# Patient Record
Sex: Male | Born: 1990 | Race: Black or African American | Hispanic: No | Marital: Single | Smoking: Never smoker
Health system: Southern US, Community
[De-identification: ages and names within clinical notes are randomized; demographics above are authoritative.]

## PROBLEM LIST (undated history)

## (undated) DIAGNOSIS — J45909 Unspecified asthma, uncomplicated: Secondary | ICD-10-CM

---
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
## 2012-01-09 ENCOUNTER — Encounter (HOSPITAL_COMMUNITY): Payer: Self-pay

## 2012-01-09 ENCOUNTER — Emergency Department (HOSPITAL_COMMUNITY)
Admission: EM | Admit: 2012-01-09 | Discharge: 2012-01-09 | Disposition: A | Discharge status: Home / Self Care | Payer: No Typology Code available for payment source | Attending: Emergency Medicine | Admitting: Emergency Medicine

## 2012-01-09 DIAGNOSIS — R51 Headache: Secondary | ICD-10-CM | POA: Insufficient documentation

## 2012-01-09 DIAGNOSIS — S0990XA Unspecified injury of head, initial encounter: Secondary | ICD-10-CM | POA: Insufficient documentation

## 2012-01-09 MED ORDER — IBUPROFEN 800 MG PO TABS: 800.0000 mg | ORAL_TABLET | Freq: Three times a day (TID) | ORAL | Status: AC | PRN
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
## 2012-01-21 NOTE — ED Provider Notes (Signed)
Evaluation and management procedures were performed by the PA/NP under my supervision/collaboration.    Felisa Bonier, MD 01/21/12 [phone]

## 2013-01-08 ENCOUNTER — Emergency Department (HOSPITAL_COMMUNITY)
Admission: EM | Admit: 2013-01-08 | Discharge: 2013-01-08 | Disposition: A | Discharge status: Home / Self Care | Payer: No Typology Code available for payment source | Attending: Emergency Medicine | Admitting: Emergency Medicine

## 2013-01-08 ENCOUNTER — Emergency Department (HOSPITAL_COMMUNITY): Payer: No Typology Code available for payment source

## 2013-01-08 ENCOUNTER — Encounter (HOSPITAL_COMMUNITY): Payer: Self-pay | Admitting: Emergency Medicine

## 2013-01-08 DIAGNOSIS — Y92410 Unspecified street and highway as the place of occurrence of the external cause: Secondary | ICD-10-CM | POA: Insufficient documentation

## 2013-01-08 DIAGNOSIS — S161XXA Strain of muscle, fascia and tendon at neck level, initial encounter: Secondary | ICD-10-CM

## 2013-01-08 DIAGNOSIS — S139XXA Sprain of joints and ligaments of unspecified parts of neck, initial encounter: Secondary | ICD-10-CM | POA: Insufficient documentation

## 2013-01-08 DIAGNOSIS — Y9389 Activity, other specified: Secondary | ICD-10-CM | POA: Insufficient documentation

## 2013-01-08 DIAGNOSIS — Z87891 Personal history of nicotine dependence: Secondary | ICD-10-CM | POA: Insufficient documentation

## 2013-01-08 MED ORDER — NAPROXEN 500 MG PO TABS: 500.0000 mg | ORAL_TABLET | Freq: Two times a day (BID) | ORAL | Status: DC

## 2013-01-08 MED ORDER — CYCLOBENZAPRINE HCL 10 MG PO TABS: 10.0000 mg | ORAL_TABLET | Freq: Two times a day (BID) | ORAL | Status: DC | PRN
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
## 2013-01-08 NOTE — ED Notes (Signed)
Patient transported to X-ray 

## 2013-01-08 NOTE — ED Notes (Signed)
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

## 2013-01-08 NOTE — ED Provider Notes (Signed)
Medical screening examination/treatment/procedure(s) were performed by non-physician practitioner and as supervising physician I was immediately available for consultation/collaboration.   Dione Booze, MD 01/08/13 [phone]

## 2013-07-22 ENCOUNTER — Emergency Department (HOSPITAL_COMMUNITY)
Admission: EM | Admit: 2013-07-22 | Discharge: 2013-07-22 | Disposition: A | Discharge status: Home / Self Care | Payer: No Typology Code available for payment source | Attending: Emergency Medicine | Admitting: Emergency Medicine

## 2013-07-22 ENCOUNTER — Encounter (HOSPITAL_COMMUNITY): Payer: Self-pay | Admitting: Emergency Medicine

## 2013-07-22 DIAGNOSIS — Y9389 Activity, other specified: Secondary | ICD-10-CM | POA: Insufficient documentation

## 2013-07-22 DIAGNOSIS — Z87891 Personal history of nicotine dependence: Secondary | ICD-10-CM | POA: Insufficient documentation

## 2013-07-22 DIAGNOSIS — Y92009 Unspecified place in unspecified non-institutional (private) residence as the place of occurrence of the external cause: Secondary | ICD-10-CM | POA: Insufficient documentation

## 2013-07-22 DIAGNOSIS — S0990XA Unspecified injury of head, initial encounter: Secondary | ICD-10-CM | POA: Insufficient documentation

## 2013-07-22 DIAGNOSIS — S0993XA Unspecified injury of face, initial encounter: Secondary | ICD-10-CM | POA: Insufficient documentation

## 2013-07-22 DIAGNOSIS — IMO0002 Reserved for concepts with insufficient information to code with codable children: Secondary | ICD-10-CM | POA: Insufficient documentation

## 2013-07-22 DIAGNOSIS — M542 Cervicalgia: Secondary | ICD-10-CM

## 2013-07-22 DIAGNOSIS — M549 Dorsalgia, unspecified: Secondary | ICD-10-CM

## 2013-07-22 HISTORY — DX: Unspecified asthma, uncomplicated: J45.909

## 2013-07-22 MED ORDER — CYCLOBENZAPRINE HCL 10 MG PO TABS: 10.0000 mg | ORAL_TABLET | Freq: Three times a day (TID) | ORAL | Status: DC | PRN

## 2013-07-22 MED ORDER — IBUPROFEN 800 MG PO TABS: 800.0000 mg | ORAL_TABLET | Freq: Three times a day (TID) | ORAL | Status: DC | PRN
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
## 2013-07-22 NOTE — ED Notes (Addendum)
PA at bedside.

## 2013-07-22 NOTE — Progress Notes (Signed)
[facility] provided patient with a list of primary care resources. Patient stated that he may have insurance through his parents, but didn't have the information.

## 2013-07-23 NOTE — ED Provider Notes (Signed)
Medical screening examination/treatment/procedure(s) were performed by non-physician practitioner and as supervising physician I was immediately available for consultation/collaboration.   Macayla Ekdahl, MD 07/23/13 0030 
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
## 2014-02-01 ENCOUNTER — Emergency Department (HOSPITAL_COMMUNITY): Payer: Self-pay

## 2014-02-01 ENCOUNTER — Encounter (HOSPITAL_COMMUNITY): Payer: Self-pay | Admitting: Emergency Medicine

## 2014-02-01 ENCOUNTER — Emergency Department (HOSPITAL_COMMUNITY)
Admission: EM | Admit: 2014-02-01 | Discharge: 2014-02-01 | Disposition: A | Discharge status: Home / Self Care | Payer: Self-pay | Attending: Emergency Medicine | Admitting: Emergency Medicine

## 2014-02-01 DIAGNOSIS — R079 Chest pain, unspecified: Secondary | ICD-10-CM | POA: Insufficient documentation

## 2014-02-01 DIAGNOSIS — K529 Noninfective gastroenteritis and colitis, unspecified: Secondary | ICD-10-CM

## 2014-02-01 DIAGNOSIS — K5289 Other specified noninfective gastroenteritis and colitis: Secondary | ICD-10-CM | POA: Insufficient documentation

## 2014-02-01 DIAGNOSIS — J45909 Unspecified asthma, uncomplicated: Secondary | ICD-10-CM | POA: Insufficient documentation

## 2014-02-01 LAB — URINE MICROSCOPIC-ADD ON

## 2014-02-01 LAB — COMPREHENSIVE METABOLIC PANEL
ALT: 25 U/L (ref 0–53)
AST: 21 U/L (ref 0–37)
Albumin: 4.9 g/dL (ref 3.5–5.2)
Alkaline Phosphatase: 86 U/L (ref 39–117)
BUN: 12 mg/dL (ref 6–23)
CO2: 26 mEq/L (ref 19–32)
Calcium: 10.2 mg/dL (ref 8.4–10.5)
Chloride: 102 mEq/L (ref 96–112)
Creatinine, Ser: 1.31 mg/dL (ref 0.50–1.35)
GFR calc Af Amer: 88 mL/min — ABNORMAL LOW (ref >90)
GFR calc non Af Amer: 76 mL/min — ABNORMAL LOW (ref >90)
Glucose, Bld: 138 mg/dL — ABNORMAL HIGH (ref 70–99)
Potassium: 5.1 mEq/L (ref 3.7–5.3)
Sodium: 140 mEq/L (ref 137–147)
Total Bilirubin: 1.1 mg/dL (ref 0.3–1.2)
Total Protein: 9.3 g/dL — ABNORMAL HIGH (ref 6.0–8.3)

## 2014-02-01 LAB — URINALYSIS, ROUTINE W REFLEX MICROSCOPIC
Bilirubin Urine: NEGATIVE
Glucose, UA: NEGATIVE mg/dL
Ketones, ur: NEGATIVE mg/dL
Leukocytes, UA: NEGATIVE
Nitrite: NEGATIVE
Protein, ur: 30 mg/dL — AB
Specific Gravity, Urine: 1.022 (ref 1.005–1.030)
Urobilinogen, UA: 0.2 mg/dL (ref 0.0–1.0)
pH: 5.5 (ref 5.0–8.0)

## 2014-02-01 LAB — CBC WITH DIFFERENTIAL/PLATELET
Basophils Absolute: 0 10*3/uL (ref 0.0–0.1)
Basophils Relative: 0 % (ref 0–1)
Eosinophils Absolute: 0 10*3/uL (ref 0.0–0.7)
Eosinophils Relative: 0 % (ref 0–5)
HCT: 49.4 % (ref 39.0–52.0)
Hemoglobin: 17.8 g/dL — ABNORMAL HIGH (ref 13.0–17.0)
Lymphocytes Relative: 2 % — ABNORMAL LOW (ref 12–46)
Lymphs Abs: 0.3 10*3/uL — ABNORMAL LOW (ref 0.7–4.0)
MCH: 32.8 pg (ref 26.0–34.0)
MCHC: 36 g/dL (ref 30.0–36.0)
MCV: 91 fL (ref 78.0–100.0)
Monocytes Absolute: 0.6 10*3/uL (ref 0.1–1.0)
Monocytes Relative: 4 % (ref 3–12)
Neutro Abs: 14.7 10*3/uL — ABNORMAL HIGH (ref 1.7–7.7)
Neutrophils Relative %: 95 % — ABNORMAL HIGH (ref 43–77)
Platelets: 243 10*3/uL (ref 150–400)
RBC: 5.43 MIL/uL (ref 4.22–5.81)
RDW: 13.4 % (ref 11.5–15.5)
WBC: 15.6 10*3/uL — ABNORMAL HIGH (ref 4.0–10.5)

## 2014-02-01 LAB — POCT I-STAT TROPONIN I: Troponin i, poc: 0.01 ng/mL (ref 0.00–0.08)

## 2014-02-01 LAB — LIPASE, BLOOD: Lipase: 22 U/L (ref 11–59)

## 2014-02-01 MED ORDER — ONDANSETRON 4 MG PO TBDP: ORAL_TABLET | ORAL | Status: AC

## 2014-02-01 MED ORDER — SODIUM CHLORIDE 0.9 % IV BOLUS (SEPSIS)
2000.0000 mL | Freq: Once | INTRAVENOUS | Status: AC
  Administered 2014-02-01: 2000 mL via INTRAVENOUS

## 2014-02-01 MED ORDER — ONDANSETRON HCL 4 MG/2ML IJ SOLN
4.0000 mg | Freq: Once | INTRAMUSCULAR | Status: AC
  Administered 2014-02-01: 4 mg via INTRAVENOUS
  Filled 2014-02-01: qty 2

## 2014-02-01 MED ORDER — KETOROLAC TROMETHAMINE 30 MG/ML IJ SOLN
30.0000 mg | Freq: Once | INTRAMUSCULAR | Status: AC
  Administered 2014-02-01: 30 mg via INTRAVENOUS
  Filled 2014-02-01: qty 1

## 2014-02-01 NOTE — ED Notes (Signed)
Pt. Was unable to use the restroom at this time, but is aware that we need a specimen and has a urine cup.

## 2014-02-01 NOTE — Discharge Instructions (Signed)
Viral Gastroenteritis °Viral gastroenteritis is also known as stomach flu. This condition affects the stomach and intestinal tract. It can cause sudden diarrhea and vomiting. The illness typically lasts 3 to 8 days. Most people develop an immune response that eventually gets rid of the virus. While this natural response develops, the virus can make you quite ill. °CAUSES  °Many different viruses can cause gastroenteritis, such as rotavirus or noroviruses. You can catch one of these viruses by consuming contaminated food or water. You may also catch a virus by sharing utensils or other personal items with an infected person or by touching a contaminated surface. °SYMPTOMS  °The most common symptoms are diarrhea and vomiting. These problems can cause a severe loss of body fluids (dehydration) and a body salt (electrolyte) imbalance. Other symptoms may include: °· Fever. °· Headache. °· Fatigue. °· Abdominal pain. °DIAGNOSIS  °Your caregiver can usually diagnose viral gastroenteritis based on your symptoms and a physical exam. A stool sample may also be taken to test for the presence of viruses or other infections. °TREATMENT  °This illness typically goes away on its own. Treatments are aimed at rehydration. The most serious cases of viral gastroenteritis involve vomiting so severely that you are not able to keep fluids down. In these cases, fluids must be given through an intravenous line (IV). °HOME CARE INSTRUCTIONS  °· Drink enough fluids to keep your urine clear or pale yellow. Drink small amounts of fluids frequently and increase the amounts as tolerated. °· Ask your caregiver for specific rehydration instructions. °· Avoid: °· Foods high in sugar. °· Alcohol. °· Carbonated drinks. °· Tobacco. °· Juice. °· Caffeine drinks. °· Extremely hot or cold fluids. °· Fatty, greasy foods. °· Too much intake of anything at one time. °· Dairy products until 24 to 48 hours after diarrhea stops. °· You may consume probiotics.  Probiotics are active cultures of beneficial bacteria. They may lessen the amount and number of diarrheal stools in adults. Probiotics can be found in yogurt with active cultures and in supplements. °· Wash your hands well to avoid spreading the virus. °· Only take over-the-counter or prescription medicines for pain, discomfort, or fever as directed by your caregiver. Do not give aspirin to children. Antidiarrheal medicines are not recommended. °· Ask your caregiver if you should continue to take your regular prescribed and over-the-counter medicines. °· Keep all follow-up appointments as directed by your caregiver. °SEEK IMMEDIATE MEDICAL CARE IF:  °· You are unable to keep fluids down. °· You do not urinate at least once every 6 to 8 hours. °· You develop shortness of breath. °· You notice blood in your stool or vomit. This may look like coffee grounds. °· You have abdominal pain that increases or is concentrated in one small area (localized). °· You have persistent vomiting or diarrhea. °· You have a fever. °· The patient is a child younger than 3 months, and he or she has a fever. °· The patient is a child older than 3 months, and he or she has a fever and persistent symptoms. °· The patient is a child older than 3 months, and he or she has a fever and symptoms suddenly get worse. °· The patient is a baby, and he or she has no tears when crying. °MAKE SURE YOU:  °· Understand these instructions. °· Will watch your condition. °· Will get help right away if you are not doing well or get worse. °Document Released: 12/08/2005 Document Revised: 03/01/2012 Document Reviewed: 09/24/2011 °  ExitCare Patient Information 2014 [place], [place].   Emergency Department Resource Guide 1) Find a Doctor and Pay Out of Pocket Although you won't have to find out who is covered by your insurance plan, it is a good idea to ask around and get recommendations. You will then need to call the office and see if the doctor you have  chosen will accept you as a new patient and what types of options they offer for patients who are self-pay. Some doctors offer discounts or will set up payment plans for their patients who do not have insurance, but you will need to ask so you aren't surprised when you get to your appointment.  2) Contact Your Local Health Department Not all health departments have doctors that can see patients for sick visits, but many do, so it is worth a call to see if yours does. If you don't know where your local health department is, you can check in your phone book. The CDC also has a tool to help you locate your state's health department, and many state websites also have listings of all of their local health departments.  3) Find a Walk-in Clinic If your illness is not likely to be very severe or complicated, you may want to try a walk in clinic. These are popping up all over the country in pharmacies, drugstores, and shopping centers. They're usually staffed by nurse practitioners or physician assistants that have been trained to treat common illnesses and complaints. They're usually fairly quick and inexpensive. However, if you have serious medical issues or chronic medical problems, these are probably not your best option.  No Primary Care Doctor: - Call Health Connect at  [phone] - they can help you locate a primary care doctor that  accepts your insurance, provides certain services, etc. - Physician Referral Service- [phone]  Chronic Pain Problems: Organization         Address  Phone   Notes  [facility]  [phone] Patients need to be referred by their primary care doctor.   Medication Assistance: Organization         Address  Phone   Notes  [facility] Medication [facility] [address] [place], [place] [address] [phone] --Must be a resident of [facility] -- Must have NO insurance coverage whatsoever (no Medicaid/ Medicare,  etc.) -- The pt. MUST have a primary care doctor that directs their care regularly and follows them in the community   MedAssist  [phone]   [employer]  [phone]    Agencies that provide inexpensive medical care: Organization         Address  Phone   Notes  [facility]  [phone]   [facility]    [phone]   [facility] [address] [place], [place] [address] [phone]) [phone]   [facility] [place] [address], [place] [phone]   Planned Parenthood    [phone]) [phone]   [facility]    [phone]) [phone]   [facility]  [address], [place] Phone:  [phone]) [phone], Fax:  [phone]) [phone] Hours of Operation:  9 am - 6 pm, M-F.  Also accepts Medicaid/Medicare and self-pay.  [facility]  [address], [place] Phone: [phone], Fax: [phone]) [phone]. Hours of Operation:  8:30 am - 5:30 pm, M-F.  Also  accepts Medicaid and self-pay.  [facility] [place] [address], [place] Phone: [phone](336) [phone]   [facility] [address], [place], [place] [phone], Ext. [phone] Mondays & Thursdays: 7-9 AM.  First 15 patients are seen on a first come, first serve basis.    Medicaid-accepting [facility] Providers:  Organization         Address  Phone   Notes  [facility] [address], [place] [phone] Also accepts self-pay patients.  [facility] [address], [place]  [phone]) [phone]   [facility] [address], [place] [phone]   [facility] [address] [place] [address], [place] [phone]) [phone]   Renaye RakersVeita Bland [address], [place]   [phone]) [phone] Only accepts [place] Access [place] patients after they have their name applied to their card.   Self-Pay (no  insurance) in [facility] [place]:  Organization         Address  Phone   Notes  Sickle Cell Patients, [facility] [address], [place] [phone]) [phone]   [facility] [address], [place] [phone]   [facility]  [address], [place] [phone]) [phone]   [facility]/Dr. Osei-Bonsu  [address] [place] [address], [place] or [phone], [place] [phone]) [phone] Phone number for both [place] and [place] locations is the same.  [facility] [address], [place] [phone]) [phone]   [facility] [address] [place] [address], [place] or [address] [phone]   [facility] [address], [place] [phone]) [phone], phone; [phone]) [phone], fax Sees patients 1st and 3rd Saturday of every month.  Must not qualify for public or private insurance (i.e. Medicaid, Medicare, [place] Health Choice, Veterans' Benefits)  Household income should be no more than 200% of the poverty level The clinic cannot treat you if you are pregnant or think you are pregnant  Sexually transmitted diseases are not treated at the clinic.    Dental Care: Organization         Address  Phone  Notes  [facility] Department of [facility] [address], [place] [phone]) [phone] Accepts children up to age 24 who are enrolled in [place]icaid or [place] Health Choice; pregnant women with a Medicaid card; and children who have applied for Medicaid or [place] Health Choice, but were declined, whose parents can pay a reduced fee at time of service.  [facility] Department of [facility] [place]  [address], [place] [phone]) [phone] Accepts children up to age 24 who are enrolled in [place]icaid or [place] Health Choice; pregnant women with a Medicaid card; and children who have applied for Medicaid or [place] Health Choice, but were  declined, whose parents can pay a reduced fee at time of service.  [facility]  [address], [place] [phone] Patients are seen by appointment only. Walk-ins are not accepted. [facility] will see patients 24 years of age and older. Monday - Tuesday (8am-5pm) Most Wednesdays (8:30-5pm) $30 per visit, cash only  [facility]  [address], [facility] [phone] Patients are seen by appointment only. Walk-ins are not accepted. [facility] will  see patients 24 years of age and older. One Wednesday Evening (Monthly: Volunteer Based).  $30 per visit, cash only  [employer] [facility]  [phone]) [phone] for adults; Children under age 504, call Graduate Pediatric Dentistry at [phone]. Children aged 514-14, please call [phone]) [phone] to request a pediatric application.  Dental services are provided in all areas of dental care including fillings, crowns and bridges, complete and partial dentures, implants, gum treatment, root canals, and extractions. Preventive care is also provided. Treatment is provided to both adults and children. Patients are selected via a lottery and there is often a waiting list.   [facility] [address], [place]  [phone]) [phone] [email or web address]   [facility] [address], [place], [place] [phone], Ext. [phone] Second and Fourth Thursday of each month, opens at 6:30 AM; Clinic ends at 9 AM.  Patients are seen on a first-come first-served basis, and a limited number are seen during each clinic.   [facility]  [address], [place], [place] [phone]) [phone]   Eligibility Requirements You must have lived in [place], [place], or [place] counties for at least the last three months.   You cannot be eligible for state or federal sponsored National Cityhealthcare insurance, including CIGNAVeterans Administration, [place]icaid, or Harrah[employer] EntertainmentMedicare.   You generally cannot be  eligible for healthcare insurance through your employer.    How to apply: Eligibility screenings are held every Tuesday and Wednesday afternoon from 1:00 pm until 4:00 pm. You do not need an appointment for the interview!  [facility] [address], [place], [place] [phone]   [facility]  [phone]   [facility]  [phone]   [facility]  [phone]    Behavioral Health Resources in the Community: Intensive Outpatient Programs Organization         Address  Phone  Notes  [facility] [address], [place], [place] [phone]   [facility] [address], [place], [place] [phone]   [facility]: [facility] [address], [place], [place]  [phone]   [facility] [address],  [place], [place] [phone] or [phone]   Substance Abuse Resources Organization         Address  Phone  Notes  [facility] Drug Services  [phone]   [facility]  [phone]   [facility]  [phone]   [facility]  [phone]   Residential & Outpatient Substance Abuse Program  [phone]   Psychological Services Organization         Address  Phone  Notes  [facility]  [phone]- [phone]   [facility]  [phone]- [phone]   [facility] [address], [place] [phone] or [phone]    Mobile Crisis Teams Organization         Address  Phone  Notes  [facility], Mobile Crisis Care Unit  [phone]   [facility]  [address] [place], [place] [phone]   Doristine LocksSharon DeEsch [address] [place] [phone]    Self-Help/Support Groups Organization         Address  Phone             Notes  [facility] [place] - variety of support groups  336- [phone] Call for more information    Narcotics Anonymous (NA), [facility] [address], [place]  2 meetings at this location   Residential Treatment Programs Organization         Address  Phone  Notes  [facility] [address],    [place]  [phone]   [facility]  [address], [place] [id number], [place], [place] [phone]   [facility] [address], [place] [phone] Admissions: 8am-3pm M-F  [facility] [address]    [place], [place] [phone]   [facility] [address], [place], [place] [phone]   [facility] [address]  [place], [place] [phone]   [facility] - Intensive Outpatient [address], [place], [place] [phone]   [facility] ([facility] [address]  [place], [place] [phone] or [phone]   [facility] (RTS) [address] [place], [place] [phone] Accepts Medicaid  [facility] [address]  [place] [phone] Substance Abuse/Addiction Treatment   [facility] Behavioral Health Resources Organization         Address  Phone  Notes  [facility]  [phone]) [phone]   Angie FavaJulie Brannon, PhD [address], [address] [place], [place]   [phone] or [phone]   [facility]   [address] [place], [place] [phone]) [phone]   [facility] [address], [place], [place] [phone] Insurance/Medicaid/sponsorship through [facility]  [facility] [address]                                    [place], [place] [phone] Therapy/tele-psych/case  [facility] [address].   [place], [place] [phone]) [phone]    Dr. Lolly MustacheArfeen  [phone]) [phone]   [facility]  [employer] [facility] 1) [address], [place] 2) [address], [place] 3)  [address], [place] [phone](336) [phone])  [phone]  [phone]   [facility] Child Abuse Hotline [phone]) [phone] or [phone]) [phone] (After Hours)

## 2014-02-01 NOTE — Progress Notes (Signed)
[facility] CL provided pt with a list of primary care resources. Patient stated that he was pending insurance through his job.  °

## 2014-02-01 NOTE — ED Notes (Signed)
Pt c/o central chest pain, generalized abdominal pain, emesis, and diarrhea starting at 0400.  Pain score 9/10.  Denies being around anyone sick.

## 2014-02-02 NOTE — ED Provider Notes (Signed)
CSN: [id number]     Arrival date & time 02/01/14  1001 History   First MD Initiated Contact with Patient 02/01/14 1120     Chief Complaint  Patient presents with  . Abdominal Pain  . Emesis  . Diarrhea     (Consider location/radiation/quality/duration/timing/severity/associated sxs/prior Treatment) Patient is a 22 y.o. male presenting with abdominal pain, vomiting, and diarrhea.  Abdominal Pain Pain location:  Generalized Pain quality: aching and cramping   Pain radiates to:  Does not radiate Pain severity:  Moderate Onset quality:  Gradual Timing:  Constant Progression:  Unchanged Chronicity:  New Context: sick contacts   Relieved by:  Nothing Worsened by:  Nothing tried Associated symptoms: chest pain (after vomiting), diarrhea (nonbloody), nausea and vomiting (nonbilious, nonbloody)   Associated symptoms: no cough, no fever and no shortness of breath   Emesis Associated symptoms: abdominal pain and diarrhea (nonbloody)   Diarrhea Associated symptoms: abdominal pain and vomiting (nonbilious, nonbloody)   Associated symptoms: no fever     Past Medical History  Diagnosis Date  . Asthma    History reviewed. No pertinent past surgical history. History reviewed. No pertinent family history. History  Substance Use Topics  . Smoking status: Never Smoker   . Smokeless tobacco: Not on file  . Alcohol Use: Yes     Comment: occasionally    Review of Systems  Constitutional: Negative for fever.  Respiratory: Negative for cough and shortness of breath.   Cardiovascular: Positive for chest pain (after vomiting).  Gastrointestinal: Positive for nausea, vomiting (nonbilious, nonbloody), abdominal pain and diarrhea (nonbloody).  All other systems reviewed and are negative.      Allergies  Review of patient's allergies indicates no known allergies.  Home Medications   Current Outpatient Rx  Name  Route  Sig  Dispense  Refill  . ibuprofen (ADVIL,MOTRIN) 200 MG  tablet   Oral   Take 400 mg by mouth every 6 (six) hours as needed for mild pain or moderate pain.         Marland Kitchen ondansetron (ZOFRAN ODT) 4 MG disintegrating tablet      4mg  ODT q4 hours prn nausea/vomit   4 tablet   0    BP 127/55  Pulse 89  Temp(Src) 98.1 F (36.7 C) (Oral)  Resp 18  SpO2 99% Physical Exam  Nursing note and vitals reviewed. Constitutional: He is oriented to person, place, and time. He appears well-developed and well-nourished. No distress.  HENT:  Head: Normocephalic and atraumatic.  Mouth/Throat: No oropharyngeal exudate.  Eyes: EOM are normal. Pupils are equal, round, and reactive to light.  Neck: Normal range of motion. Neck supple.  Cardiovascular: Normal rate and regular rhythm.  Exam reveals no friction rub.   No murmur heard. Pulmonary/Chest: Effort normal and breath sounds normal. No respiratory distress. He has no wheezes. He has no rales.  Abdominal: He exhibits no distension. There is no tenderness. There is no rebound.  Musculoskeletal: Normal range of motion. He exhibits no edema.  Neurological: He is alert and oriented to person, place, and time.  Skin: He is not diaphoretic.    ED Course  Procedures (including critical care time) Labs Review Labs Reviewed  CBC WITH DIFFERENTIAL - Abnormal; Notable for the following:    WBC 15.6 (*)    Hemoglobin 17.8 (*)    Neutrophils Relative % 95 (*)    Neutro Abs 14.7 (*)    Lymphocytes Relative 2 (*)    Lymphs Abs 0.3 (*)  All other components within normal limits  COMPREHENSIVE METABOLIC PANEL - Abnormal; Notable for the following:    Glucose, Bld 138 (*)    Total Protein 9.3 (*)    GFR calc non Af Amer 76 (*)    GFR calc Af Amer 88 (*)    All other components within normal limits  URINALYSIS, ROUTINE W REFLEX MICROSCOPIC - Abnormal; Notable for the following:    Color, Urine AMBER (*)    Hgb urine dipstick TRACE (*)    Protein, ur 30 (*)    All other components within normal limits   LIPASE, BLOOD  URINE MICROSCOPIC-ADD ON  POCT I-STAT TROPONIN I   Imaging Review Dg Chest 2 View  02/01/2014   CLINICAL DATA:  Nausea, vomiting, diarrhea, chest pain and shortness of breath for 1 day, history asthma  EXAM: CHEST  2 VIEW  COMPARISON:  09/27/2000 exam is no longer available for comparison.  FINDINGS: Normal heart size, mediastinal contours and pulmonary vascularity.  Lungs clear.  No pleural effusion or pneumothorax.  Bones unremarkable.  IMPRESSION: No acute abnormalities.   Electronically Signed   By: Ulyses SouthwardMark  Boles M.D.   On: 02/01/2014 13:09    EKG Interpretation    Date/Time:  Wednesday February 01 2014 10:34:43 EST Ventricular Rate:  99 PR Interval:  131 QRS Duration: 76 QT Interval:  307 QTC Calculation: 394 R Axis:   97 Text Interpretation:  Sinus rhythm Biatrial enlargement Borderline right axis deviation Borderline T wave abnormalities No prior Confirmed by Gwendolyn GrantWALDEN  MD, Kimberley Speece ([id number]) on 02/01/2014 11:26:13 AM            MDM   Final diagnoses:  Gastroenteritis    24M here with abdominal pain, vomiting, diarrhea. No blood in either vomiting or diarrhea. Multiple episodes of both. Multiple sick contacts similar symptoms. Patient is having some chest pain after vomiting with central aching.  Vitals stable here. Chest without crepitus, lungs clear. No rales or rhonchi. Belly is benign. No concern for esophageal perforation. CXR normal. Labs normal. Symptoms much improved and he is feeling better after fluids. Given Rx for zofran. Stable for discharge.    Dagmar HaitWilliam Maricsa Sammons, MD 02/02/14 [phone]

## 2014-10-29 IMAGING — CR DG CHEST 2V
2 series · 2 of 2 positions shown · non-contrast
Comparison: 09/27/2000 exam is no longer available for comparison.

CLINICAL DATA: Nausea, vomiting, diarrhea, chest pain and shortness
of breath for 1 day, history asthma

EXAM:
CHEST  2 VIEW

[w chest pa]
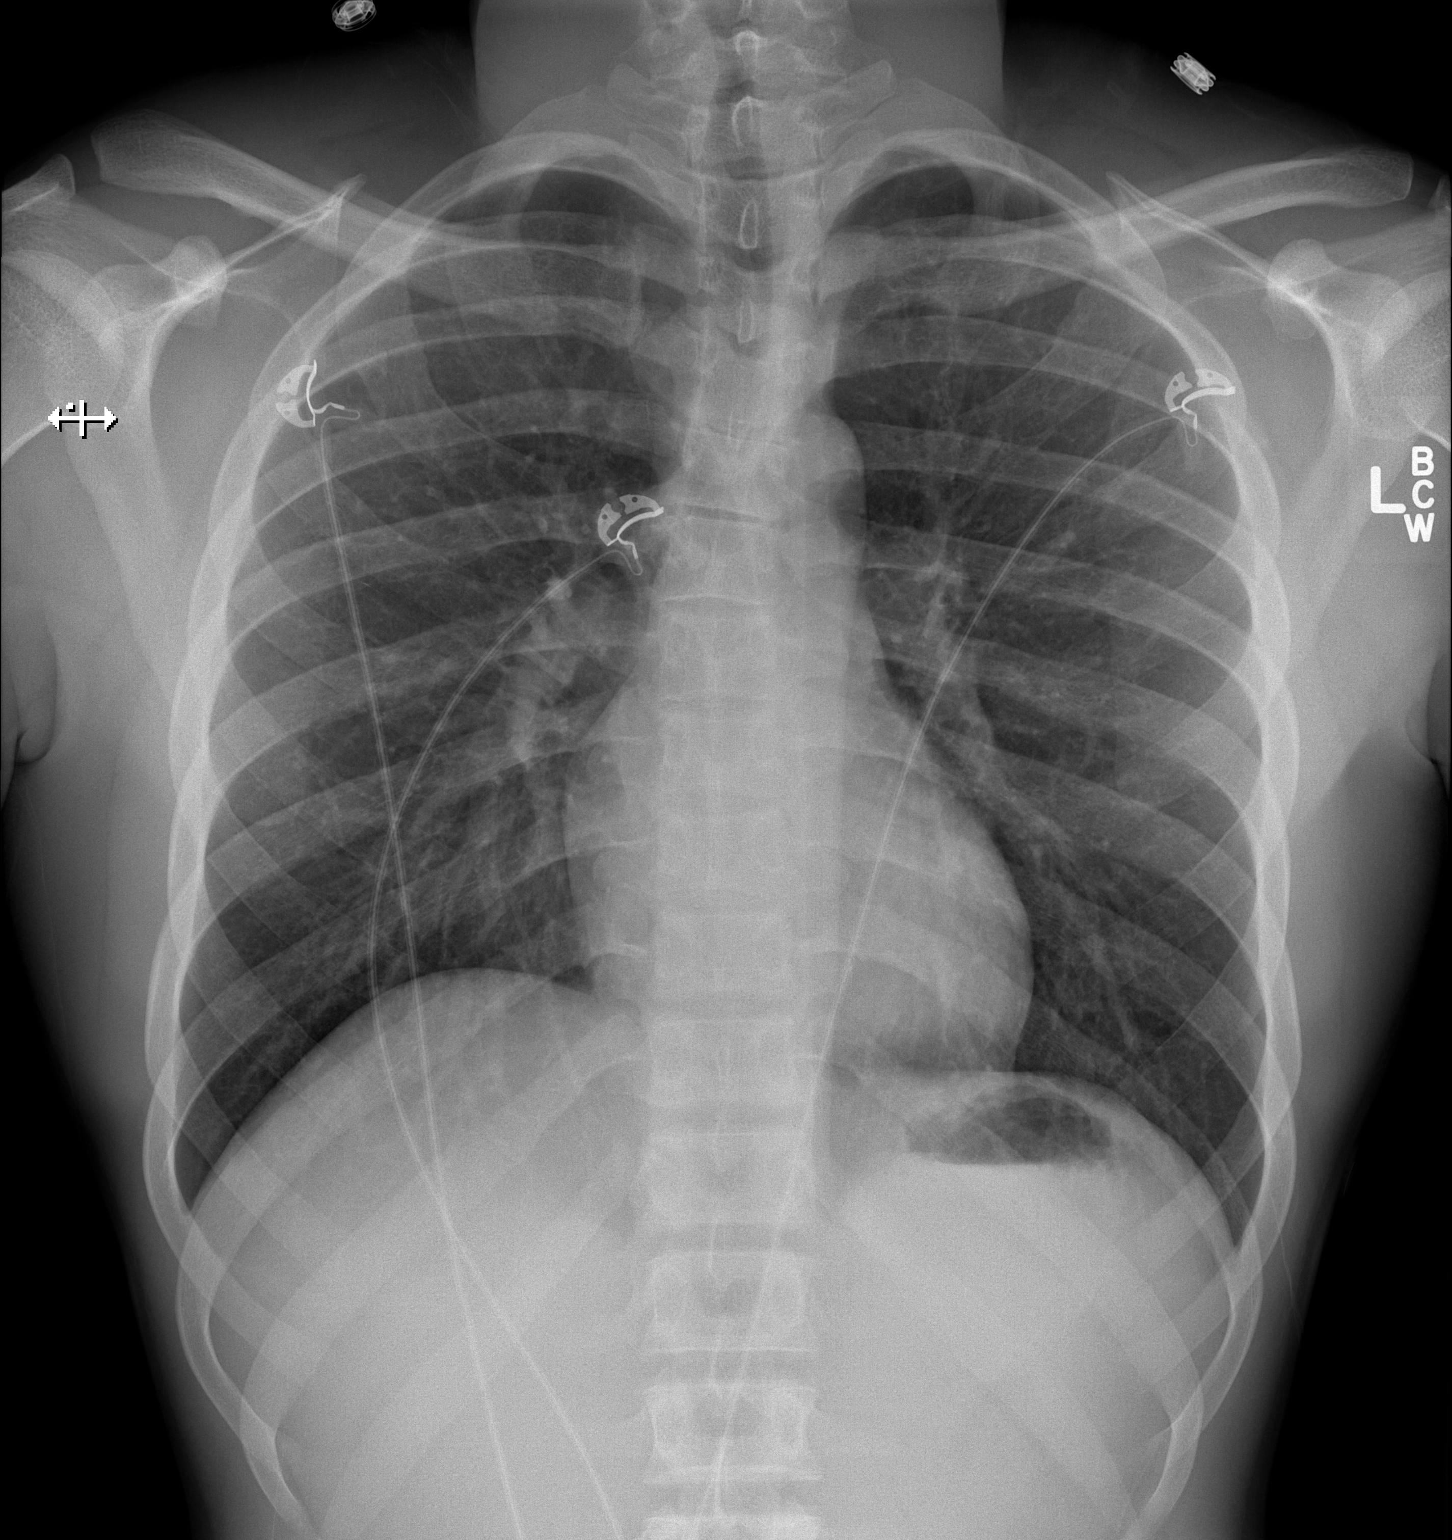

[w chest lat]
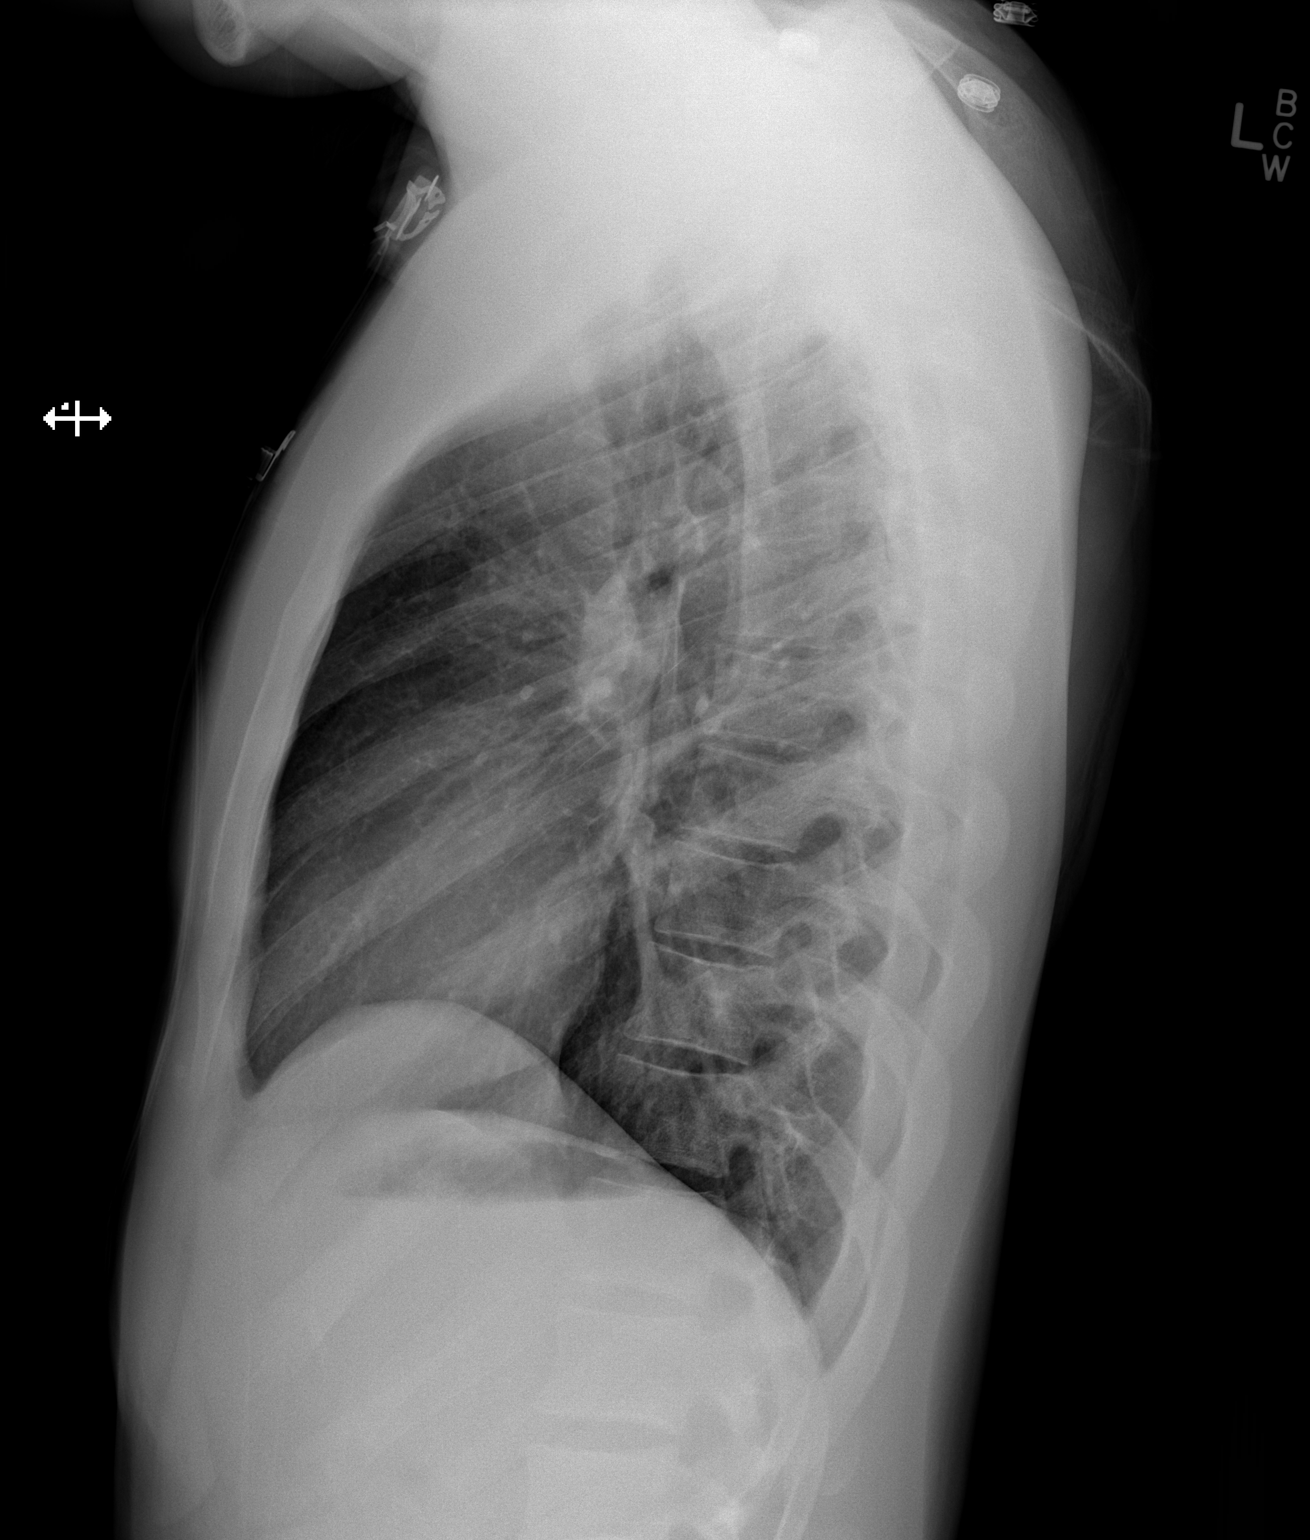

[2 of 2 positions shown; findings below may reference images not displayed]

FINDINGS: Normal heart size, mediastinal contours and pulmonary vascularity.

Lungs clear.

No pleural effusion or pneumothorax.

Bones unremarkable.
IMPRESSION: No acute abnormalities.
